# Patient Record
Sex: Female | Born: 1985 | Race: Black or African American | Hispanic: No | Marital: Married | Smoking: Never smoker
Health system: Southern US, Community
[De-identification: ages and names within clinical notes are randomized; demographics above are authoritative.]

## PROBLEM LIST (undated history)

## (undated) DIAGNOSIS — I10 Essential (primary) hypertension: Secondary | ICD-10-CM

## (undated) DIAGNOSIS — Z8759 Personal history of other complications of pregnancy, childbirth and the puerperium: Secondary | ICD-10-CM

---

## 2004-01-19 ENCOUNTER — Encounter: Admission: RE | Admit: 2004-01-19 | Discharge: 2004-01-19 | Payer: Self-pay | Admitting: Family Medicine

## 2004-01-20 ENCOUNTER — Inpatient Hospital Stay (HOSPITAL_COMMUNITY): Admission: AD | Admit: 2004-01-20 | Discharge: 2004-01-27 | Payer: Self-pay | Admitting: Family Medicine

## 2004-01-27 ENCOUNTER — Inpatient Hospital Stay (HOSPITAL_COMMUNITY): Admission: AD | Admit: 2004-01-27 | Discharge: 2004-01-29 | Payer: Self-pay | Admitting: Obstetrics & Gynecology

## 2013-08-14 ENCOUNTER — Encounter (HOSPITAL_BASED_OUTPATIENT_CLINIC_OR_DEPARTMENT_OTHER): Payer: Self-pay | Admitting: Emergency Medicine

## 2013-08-14 ENCOUNTER — Emergency Department (HOSPITAL_BASED_OUTPATIENT_CLINIC_OR_DEPARTMENT_OTHER)
Admission: EM | Admit: 2013-08-14 | Discharge: 2013-08-14 | Disposition: A | Discharge status: Home / Self Care | Payer: Self-pay | Attending: Emergency Medicine | Admitting: Emergency Medicine

## 2013-08-14 DIAGNOSIS — I10 Essential (primary) hypertension: Secondary | ICD-10-CM | POA: Insufficient documentation

## 2013-08-14 DIAGNOSIS — J111 Influenza due to unidentified influenza virus with other respiratory manifestations: Secondary | ICD-10-CM | POA: Insufficient documentation

## 2013-08-14 DIAGNOSIS — R197 Diarrhea, unspecified: Secondary | ICD-10-CM | POA: Insufficient documentation

## 2013-08-14 DIAGNOSIS — R112 Nausea with vomiting, unspecified: Secondary | ICD-10-CM | POA: Insufficient documentation

## 2013-08-14 DIAGNOSIS — Z3202 Encounter for pregnancy test, result negative: Secondary | ICD-10-CM | POA: Insufficient documentation

## 2013-08-14 HISTORY — DX: Essential (primary) hypertension: I10

## 2013-08-14 LAB — PREGNANCY, URINE: Preg Test, Ur: NEGATIVE

## 2013-08-14 MED ORDER — SODIUM CHLORIDE 0.9 % IV BOLUS (SEPSIS)
1000.0000 mL | Freq: Once | INTRAVENOUS | Status: AC
  Administered 2013-08-14: 1000 mL via INTRAVENOUS

## 2013-08-14 MED ORDER — ONDANSETRON HCL 4 MG/2ML IJ SOLN
4.0000 mg | Freq: Once | INTRAMUSCULAR | Status: AC
  Administered 2013-08-14: 4 mg via INTRAVENOUS
  Filled 2013-08-14: qty 2

## 2013-08-14 MED ORDER — OSELTAMIVIR PHOSPHATE 75 MG PO CAPS: 75.0000 mg | ORAL_CAPSULE | Freq: Two times a day (BID) | ORAL | Status: DC

## 2013-08-14 MED ORDER — KETOROLAC TROMETHAMINE 30 MG/ML IJ SOLN
30.0000 mg | Freq: Once | INTRAMUSCULAR | Status: AC
  Administered 2013-08-14: 30 mg via INTRAVENOUS
  Filled 2013-08-14: qty 1

## 2013-08-14 MED ORDER — ONDANSETRON 4 MG PO TBDP: ORAL_TABLET | ORAL | Status: DC

## 2013-08-14 NOTE — ED Notes (Signed)
Fever, chills, cough, emesis x 1, generalized weakness since yesterday.

## 2013-08-14 NOTE — ED Provider Notes (Signed)
CSN: [id number]     Arrival date & time 08/14/13  1417 History   First MD Initiated Contact with Patient 08/14/13 1453     Chief Complaint  Patient presents with  . Fever  . Cough  . flu like illness    (Consider location/radiation/quality/duration/timing/severity/associated sxs/prior Treatment) Patient is a 27 y.o. female presenting with fever and cough. The history is provided by the patient.  Fever Max temp prior to arrival:  Not checked Temp source:  Subjective Severity:  Mild Onset quality:  Sudden Timing:  Constant Progression:  Worsening Chronicity:  New Relieved by:  Nothing Worsened by:  Nothing tried Associated symptoms: chills, congestion, cough (non-productive), diarrhea (multiple loose stools), rhinorrhea and vomiting (once)   Associated symptoms: no chest pain and no rash   Cough Associated symptoms: chills, fever and rhinorrhea   Associated symptoms: no chest pain and no rash     Past Medical History  Diagnosis Date  . Hypertension    No past surgical history on file. No family history on file. History  Substance Use Topics  . Smoking status: Never Smoker   . Smokeless tobacco: Not on file  . Alcohol Use: Not on file   OB History   Grav Para Term Preterm Abortions TAB SAB Ect Mult Living                 Review of Systems  Constitutional: Positive for fever and chills.  HENT: Positive for congestion and rhinorrhea.   Respiratory: Positive for cough (non-productive).   Cardiovascular: Negative for chest pain.  Gastrointestinal: Positive for vomiting (once) and diarrhea (multiple loose stools).  Skin: Negative for rash.  All other systems reviewed and are negative.    Allergies  Review of patient's allergies indicates no known allergies.  Home Medications  No current outpatient prescriptions on file. BP 177/117  Pulse 85  Temp(Src) 99 F (37.2 C)  Resp 16  Ht 5\' 2"  (1.575 m)  Wt 210 lb (95.255 kg)  BMI 38.40 kg/m2  SpO2 99%  LMP  08/09/2013 Physical Exam  Nursing note and vitals reviewed. Constitutional: She is oriented to person, place, and time. She appears well-developed and well-nourished. No distress.  HENT:  Head: Normocephalic and atraumatic.  Eyes: EOM are normal. Pupils are equal, round, and reactive to light.  Neck: Normal range of motion. Neck supple.  Cardiovascular: Normal rate and regular rhythm.  Exam reveals no friction rub.   No murmur heard. Pulmonary/Chest: Effort normal and breath sounds normal. No respiratory distress. She has no wheezes. She has no rales.  Abdominal: Soft. She exhibits no distension. There is no tenderness. There is no rebound.  Musculoskeletal: Normal range of motion. She exhibits no edema.  Neurological: She is alert and oriented to person, place, and time. No cranial nerve deficit. She exhibits normal muscle tone. Coordination normal.  Skin: No rash noted. She is not diaphoretic.    ED Course  Procedures (including critical care time) Labs Review Labs Reviewed - No data to display Imaging Review No results found.  EKG Interpretation   None       MDM   1. Influenza-like illness    40F here with influenza like illness. All symptoms began this yesterday. No meds tried. Afebrile here, vitals stable. Exam benign. No concern with pneumonia with non-productive cough, normal lung exam, normal sats. No need for chest x-ray. Will give fluids, zofran, toradol. Plan on tamiflu prescription as she has young children at home.  Patient feeling much  better after fluids. Given Tamiflu. Stable for discharge  Dagmar Hait, MD 08/14/13 [phone]

## 2014-07-06 ENCOUNTER — Encounter (HOSPITAL_BASED_OUTPATIENT_CLINIC_OR_DEPARTMENT_OTHER): Payer: Self-pay

## 2014-07-06 ENCOUNTER — Emergency Department (HOSPITAL_BASED_OUTPATIENT_CLINIC_OR_DEPARTMENT_OTHER)
Admission: EM | Admit: 2014-07-06 | Discharge: 2014-07-06 | Disposition: A | Discharge status: Home / Self Care | Payer: Self-pay | Attending: Emergency Medicine | Admitting: Emergency Medicine

## 2014-07-06 DIAGNOSIS — I10 Essential (primary) hypertension: Secondary | ICD-10-CM | POA: Insufficient documentation

## 2014-07-06 DIAGNOSIS — R51 Headache: Secondary | ICD-10-CM | POA: Insufficient documentation

## 2014-07-06 DIAGNOSIS — R519 Headache, unspecified: Secondary | ICD-10-CM

## 2014-07-06 MED ORDER — LISINOPRIL 10 MG PO TABS
10.0000 mg | ORAL_TABLET | Freq: Once | ORAL | Status: AC
  Administered 2014-07-06: 10 mg via ORAL
  Filled 2014-07-06: qty 1

## 2014-07-06 MED ORDER — METOCLOPRAMIDE HCL 5 MG/ML IJ SOLN
10.0000 mg | Freq: Once | INTRAMUSCULAR | Status: AC
  Administered 2014-07-06: 10 mg via INTRAVENOUS
  Filled 2014-07-06: qty 2

## 2014-07-06 MED ORDER — DIPHENHYDRAMINE HCL 50 MG/ML IJ SOLN
25.0000 mg | Freq: Once | INTRAMUSCULAR | Status: AC
  Administered 2014-07-06: 25 mg via INTRAVENOUS
  Filled 2014-07-06: qty 1

## 2014-07-06 MED ORDER — SODIUM CHLORIDE 0.9 % IV BOLUS (SEPSIS)
1000.0000 mL | Freq: Once | INTRAVENOUS | Status: AC
Start: 2014-07-06 — End: 2014-07-06
  Administered 2014-07-06: 1000 mL via INTRAVENOUS

## 2014-07-06 MED ORDER — KETOROLAC TROMETHAMINE 30 MG/ML IJ SOLN
30.0000 mg | Freq: Once | INTRAMUSCULAR | Status: AC
  Administered 2014-07-06: 30 mg via INTRAVENOUS
  Filled 2014-07-06: qty 1

## 2014-07-06 MED ORDER — LISINOPRIL 10 MG PO TABS: 10.0000 mg | ORAL_TABLET | Freq: Once | ORAL | Status: DC

## 2014-07-06 NOTE — Discharge Instructions (Signed)
As discussed, it is important that you follow up with your physicians for continued management of your condition. ° °If you develop any new, or concerning changes in your condition, please return to the emergency department immediately. ° ° ° °

## 2014-07-06 NOTE — ED Provider Notes (Signed)
CSN: [id number]     Arrival date & time 07/06/14  1435 History   First MD Initiated Contact with Patient 07/06/14 1516     Chief Complaint  Patient presents with  . Headache      HPI  Patient presents with concerns of headache. This episode began 2 days ago, gradually has become more severe.  The pain is front left sided, sore, nonradiating, severe. No relief with OTC medication. No new visual loss, neck pain, nausea, vomiting, confusion, disorientation. Patient has a history of headaches for years. Patient also has a history of hypertension, for which she stopped taking her medication approximately 8 months ago.   Past Medical History  Diagnosis Date  . Hypertension    History reviewed. No pertinent past surgical history. No family history on file. History  Substance Use Topics  . Smoking status: Never Smoker   . Smokeless tobacco: Not on file  . Alcohol Use: No   OB History    No data available     Review of Systems  Constitutional:       Per HPI, otherwise negative  HENT:       Per HPI, otherwise negative  Respiratory:       Per HPI, otherwise negative  Cardiovascular:       Per HPI, otherwise negative  Gastrointestinal: Negative for vomiting.  Endocrine:       Negative aside from HPI  Genitourinary:       Neg aside from HPI   Musculoskeletal:       Per HPI, otherwise negative  Skin: Negative.   Neurological: Positive for headaches. Negative for dizziness, tremors, seizures, syncope, facial asymmetry, speech difficulty, weakness, light-headedness and numbness.      Allergies  Review of patient's allergies indicates no known allergies.  Home Medications   Prior to Admission medications   Not on File   BP 159/108 mmHg  Pulse 69  Temp(Src) 98.9 F (37.2 C) (Oral)  Resp 18  Ht 5\' 1"  (1.549 m)  Wt 210 lb (95.255 kg)  BMI 39.70 kg/m2  SpO2 100% Physical Exam  Constitutional: She is oriented to person, place, and time. She appears well-developed  and well-nourished. No distress.  HENT:  Head: Normocephalic and atraumatic.  Eyes: Conjunctivae and EOM are normal.  Cardiovascular: Normal rate and regular rhythm.   Pulmonary/Chest: Effort normal and breath sounds normal. No stridor. No respiratory distress.  Abdominal: She exhibits no distension.  Musculoskeletal: She exhibits no edema.  Neurological: She is alert and oriented to person, place, and time. She displays no atrophy and no tremor. No cranial nerve deficit or sensory deficit. She exhibits normal muscle tone. Coordination and gait normal.  Skin: Skin is warm and dry.  Psychiatric: She has a normal mood and affect.  Nursing note and vitals reviewed.   ED Course  Procedures (including critical care time) Patient is hypertensive on arrival.  Patient received her prior home medication in addition to analgesia for headache.  On repeat exam the patient appears better.   MDM  Female presents with concerns of headache, elevated blood pressure. Patient has no neurologic deficits, is awake and alert, otherwise hemodynamically stable. Patient's blood pressure improved here, pain resolved her, and absent alarming findings, she was discharged in stable condition with initiation of blood pressure medication, primary care follow-up.   Gerhard Munchobert Madeline Bebout, MD 07/06/14 [id number]

## 2014-07-06 NOTE — ED Notes (Signed)
C/o HA x 2-3 days-feels like BP is elevated-no BOP meds x 8 monhts

## 2016-10-13 ENCOUNTER — Encounter (HOSPITAL_BASED_OUTPATIENT_CLINIC_OR_DEPARTMENT_OTHER): Payer: Self-pay | Admitting: Emergency Medicine

## 2016-10-13 ENCOUNTER — Emergency Department (HOSPITAL_BASED_OUTPATIENT_CLINIC_OR_DEPARTMENT_OTHER)
Admission: EM | Admit: 2016-10-13 | Discharge: 2016-10-13 | Disposition: A | Discharge status: Home / Self Care | Payer: No Typology Code available for payment source | Attending: Emergency Medicine | Admitting: Emergency Medicine

## 2016-10-13 ENCOUNTER — Emergency Department (HOSPITAL_BASED_OUTPATIENT_CLINIC_OR_DEPARTMENT_OTHER): Payer: No Typology Code available for payment source

## 2016-10-13 DIAGNOSIS — T148XXA Other injury of unspecified body region, initial encounter: Secondary | ICD-10-CM

## 2016-10-13 DIAGNOSIS — I10 Essential (primary) hypertension: Secondary | ICD-10-CM | POA: Insufficient documentation

## 2016-10-13 DIAGNOSIS — S161XXA Strain of muscle, fascia and tendon at neck level, initial encounter: Secondary | ICD-10-CM | POA: Insufficient documentation

## 2016-10-13 DIAGNOSIS — Y999 Unspecified external cause status: Secondary | ICD-10-CM | POA: Diagnosis not present

## 2016-10-13 DIAGNOSIS — Y92410 Unspecified street and highway as the place of occurrence of the external cause: Secondary | ICD-10-CM | POA: Insufficient documentation

## 2016-10-13 DIAGNOSIS — R030 Elevated blood-pressure reading, without diagnosis of hypertension: Secondary | ICD-10-CM

## 2016-10-13 DIAGNOSIS — Y9389 Activity, other specified: Secondary | ICD-10-CM | POA: Diagnosis not present

## 2016-10-13 DIAGNOSIS — S199XXA Unspecified injury of neck, initial encounter: Secondary | ICD-10-CM | POA: Diagnosis present

## 2016-10-13 MED ORDER — ACETAMINOPHEN 325 MG PO TABS
650.0000 mg | ORAL_TABLET | Freq: Once | ORAL | Status: AC
  Administered 2016-10-13: 650 mg via ORAL
  Filled 2016-10-13: qty 2

## 2016-10-13 MED ORDER — LISINOPRIL 10 MG PO TABS
10.0000 mg | ORAL_TABLET | Freq: Once | ORAL | Status: AC
  Administered 2016-10-13: 10 mg via ORAL
  Filled 2016-10-13: qty 1

## 2016-10-13 MED ORDER — METHOCARBAMOL 500 MG PO TABS: 500.0000 mg | ORAL_TABLET | Freq: Two times a day (BID) | ORAL | 0 refills | Status: DC | PRN

## 2016-10-13 MED ORDER — LISINOPRIL 10 MG PO TABS: 10.0000 mg | ORAL_TABLET | Freq: Every day | ORAL | 0 refills | Status: DC

## 2016-10-13 NOTE — Discharge Instructions (Signed)
Tylenol as needed for pain. Robaxin is your muscle relaxer to take as needed - This can make you very drowsy - please do not drink alcohol, operate heavy machinery or drive on this medication. This is best taken about an hour before bedtime. Ice or heat to affected area for additional pain relief.   Please keep your scheduled appointment on Tuesday for discussion of your blood pressure.   Return to the ER for new or worsening symptoms, any additional concerns.   COLD THERAPY DIRECTIONS:  Ice or gel packs can be used to reduce both pain and swelling. Ice is the most helpful within the first 24 to 48 hours after an injury or flareup from overusing a muscle or joint.  Ice is effective, has very few side effects, and is safe for most people to use.   If you expose your skin to cold temperatures for too long or without the proper protection, you can damage your skin or nerves. Watch for signs of skin damage due to cold.   HOME CARE INSTRUCTIONS  Follow these tips to use ice and cold packs safely.  Place a dry or damp towel between the ice and skin. A damp towel will cool the skin more quickly, so you may need to shorten the time that the ice is used.  For a more rapid response, add gentle compression to the ice.  Ice for no more than 10 to 20 minutes at a time. The bonier the area you are icing, the less time it will take to get the benefits of ice.  Check your skin after 5 minutes to make sure there are no signs of a poor response to cold or skin damage.  Rest 20 minutes or more in between uses.  Once your skin is numb, you can end your treatment. You can test numbness by very lightly touching your skin. The touch should be so light that you do not see the skin dimple from the pressure of your fingertip. When using ice, most people will feel these normal sensations in this order: cold, burning, aching, and numbness.
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
## 2017-06-05 ENCOUNTER — Emergency Department (HOSPITAL_BASED_OUTPATIENT_CLINIC_OR_DEPARTMENT_OTHER)
Admission: EM | Admit: 2017-06-05 | Discharge: 2017-06-05 | Disposition: A | Discharge status: Other Acute Inpatient Hospital | Payer: Medicaid Other | Attending: Emergency Medicine | Admitting: Emergency Medicine

## 2017-06-05 ENCOUNTER — Emergency Department (HOSPITAL_BASED_OUTPATIENT_CLINIC_OR_DEPARTMENT_OTHER): Payer: Medicaid Other

## 2017-06-05 ENCOUNTER — Encounter (HOSPITAL_BASED_OUTPATIENT_CLINIC_OR_DEPARTMENT_OTHER): Payer: Self-pay

## 2017-06-05 DIAGNOSIS — R102 Pelvic and perineal pain: Secondary | ICD-10-CM | POA: Diagnosis not present

## 2017-06-05 DIAGNOSIS — R509 Fever, unspecified: Secondary | ICD-10-CM | POA: Diagnosis present

## 2017-06-05 DIAGNOSIS — I10 Essential (primary) hypertension: Secondary | ICD-10-CM | POA: Diagnosis not present

## 2017-06-05 DIAGNOSIS — A419 Sepsis, unspecified organism: Secondary | ICD-10-CM

## 2017-06-05 DIAGNOSIS — Z79899 Other long term (current) drug therapy: Secondary | ICD-10-CM | POA: Insufficient documentation

## 2017-06-05 HISTORY — DX: Personal history of other complications of pregnancy, childbirth and the puerperium: Z87.59

## 2017-06-05 LAB — URINALYSIS, MICROSCOPIC (REFLEX)

## 2017-06-05 LAB — WET PREP, GENITAL
Clue Cells Wet Prep HPF POC: NONE SEEN
Sperm: NONE SEEN
Trich, Wet Prep: NONE SEEN
YEAST WET PREP: NONE SEEN

## 2017-06-05 LAB — COMPREHENSIVE METABOLIC PANEL
ALBUMIN: 4 g/dL (ref 3.5–5.0)
ALK PHOS: 46 U/L (ref 38–126)
ALT: 10 U/L — ABNORMAL LOW (ref 14–54)
ANION GAP: 10 (ref 5–15)
AST: 17 U/L (ref 15–41)
BILIRUBIN TOTAL: 0.8 mg/dL (ref 0.3–1.2)
BUN: 9 mg/dL (ref 6–20)
CALCIUM: 9.1 mg/dL (ref 8.9–10.3)
CO2: 22 mmol/L (ref 22–32)
Chloride: 102 mmol/L (ref 101–111)
Creatinine, Ser: 0.5 mg/dL (ref 0.44–1.00)
GFR calc Af Amer: >60 mL/min (ref >60)
GFR calc non Af Amer: >60 mL/min (ref >60)
GLUCOSE: 114 mg/dL — AB (ref 65–99)
Potassium: 2.8 mmol/L — ABNORMAL LOW (ref 3.5–5.1)
Sodium: 134 mmol/L — ABNORMAL LOW (ref 135–145)
TOTAL PROTEIN: 7.4 g/dL (ref 6.5–8.1)

## 2017-06-05 LAB — URINALYSIS, ROUTINE W REFLEX MICROSCOPIC
Bilirubin Urine: NEGATIVE
Glucose, UA: NEGATIVE mg/dL
KETONES UR: NEGATIVE mg/dL
NITRITE: NEGATIVE
PROTEIN: NEGATIVE mg/dL
Specific Gravity, Urine: 1.015 (ref 1.005–1.030)
pH: 6 (ref 5.0–8.0)

## 2017-06-05 LAB — CBC WITH DIFFERENTIAL/PLATELET
BASOS PCT: 0 %
Basophils Absolute: 0 10*3/uL (ref 0.0–0.1)
EOS ABS: 0 10*3/uL (ref 0.0–0.7)
EOS PCT: 0 %
HCT: 38.2 % (ref 36.0–46.0)
Hemoglobin: 13.4 g/dL (ref 12.0–15.0)
Lymphocytes Relative: 4 %
Lymphs Abs: 0.5 10*3/uL — ABNORMAL LOW (ref 0.7–4.0)
MCH: 34.1 pg — ABNORMAL HIGH (ref 26.0–34.0)
MCHC: 35.1 g/dL (ref 30.0–36.0)
MCV: 97.2 fL (ref 78.0–100.0)
MONO ABS: 0.3 10*3/uL (ref 0.1–1.0)
Monocytes Relative: 2 %
NEUTROS ABS: 11.2 10*3/uL — AB (ref 1.7–7.7)
Neutrophils Relative %: 94 %
PLATELETS: 293 10*3/uL (ref 150–400)
RBC: 3.93 MIL/uL (ref 3.87–5.11)
RDW: 11.9 % (ref 11.5–15.5)
WBC: 12 10*3/uL — ABNORMAL HIGH (ref 4.0–10.5)

## 2017-06-05 LAB — I-STAT CG4 LACTIC ACID, ED: Lactic Acid, Venous: 1.81 mmol/L (ref 0.5–1.9)

## 2017-06-05 LAB — HCG, QUANTITATIVE, PREGNANCY: HCG, BETA CHAIN, QUANT, S: 2863 m[IU]/mL — AB (ref <5)

## 2017-06-05 LAB — LIPASE, BLOOD: Lipase: 22 U/L (ref 11–51)

## 2017-06-05 LAB — PREGNANCY, URINE: PREG TEST UR: POSITIVE — AB

## 2017-06-05 MED ORDER — ACETAMINOPHEN 500 MG PO TABS
1000.0000 mg | ORAL_TABLET | Freq: Once | ORAL | Status: AC
  Administered 2017-06-05: 1000 mg via ORAL
  Filled 2017-06-05: qty 2

## 2017-06-05 MED ORDER — CLINDAMYCIN PHOSPHATE 600 MG/50ML IV SOLN
600.0000 mg | Freq: Once | INTRAVENOUS | Status: AC
  Administered 2017-06-05: 600 mg via INTRAVENOUS
  Filled 2017-06-05: qty 50

## 2017-06-05 MED ORDER — GENTAMICIN IN SALINE 1.6-0.9 MG/ML-% IV SOLN
INTRAVENOUS | Status: AC
  Filled 2017-06-05: qty 50

## 2017-06-05 MED ORDER — SODIUM CHLORIDE 0.9 % IV BOLUS (SEPSIS)
1000.0000 mL | Freq: Once | INTRAVENOUS | Status: AC
  Administered 2017-06-05: 1000 mL via INTRAVENOUS

## 2017-06-05 MED ORDER — POTASSIUM CHLORIDE 10 MEQ/100ML IV SOLN
10.0000 meq | Freq: Once | INTRAVENOUS | Status: AC
  Administered 2017-06-05: 10 meq via INTRAVENOUS
  Filled 2017-06-05: qty 100

## 2017-06-05 MED ORDER — AMPICILLIN SODIUM 2 G IJ SOLR
INTRAMUSCULAR | Status: AC
  Filled 2017-06-05: qty 2000

## 2017-06-05 MED ORDER — SODIUM CHLORIDE 0.9 % IV SOLN
2.0000 g | Freq: Once | INTRAVENOUS | Status: AC
  Administered 2017-06-05: 2 g via INTRAVENOUS
  Filled 2017-06-05: qty 2000

## 2017-06-05 MED ORDER — GENTAMICIN SULFATE 40 MG/ML IJ SOLN
1.5000 mg/kg | Freq: Once | INTRAMUSCULAR | Status: DC
  Filled 2017-06-05: qty 3.75

## 2017-06-05 MED ORDER — GENTAMICIN SULFATE 40 MG/ML IJ SOLN
160.0000 mg | Freq: Once | INTRAVENOUS | Status: AC
  Administered 2017-06-05: 160 mg via INTRAVENOUS
  Filled 2017-06-05: qty 4

## 2017-06-05 NOTE — ED Notes (Signed)
ED Provider at bedside. 

## 2017-06-05 NOTE — ED Notes (Signed)
[facility] here for transport to [facility] ED

## 2017-06-05 NOTE — ED Triage Notes (Addendum)
C/o"shaking" while at work-c/o n/v today-pelvic pain since miscarriage approx 17 days ago and c/o lower back pain-NAD-steady gait-pt took motrin  PTA

## 2017-06-05 NOTE — ED Provider Notes (Signed)
[facility] Provider Note   CSN: [id number] Arrival date & time: 06/05/17  1150     History   Chief Complaint Chief Complaint  Patient presents with  . Shaking    HPI Sophia Jones is a 31 y.o. female.  The history is provided by the patient.  Fever   This is a new problem. The current episode started 6 to 12 hours ago. The problem occurs constantly. Progression since onset: fluctuating. Her temperature was unmeasured prior to arrival. Pertinent negatives include no chest pain, no fussiness, no diarrhea, no vomiting, no congestion, no headaches, no sore throat, no muscle aches and no cough. She has tried ibuprofen for the symptoms. The treatment provided moderate relief.   No known sick contacts.  Of note the patient's had recent miscarriage approximately 17 days ago; given Cytotec. Bleeding had resolved until 2 days ago when she had a Mirena IUD placed. Since, she has been having suprapubic pain and small vaginal clots. No discharge or prior STIs. No dysuria, but is having frequency.   Past Medical History:  Diagnosis Date  . History of stillbirth    Preterm stillbirth via c-section 2008  . Hypertension     There are no active problems to display for this patient.   Past Surgical History:  Procedure Laterality Date  . CESAREAN SECTION  2012  . CESAREAN SECTION  2008   Baby was stillborn    OB History    Gravida Para Term Preterm AB Living   3 2 0 SAB TAB Ectopic Multiple Live Births   1 0 0 0 1       Home Medications    Prior to Admission medications   Medication Sig Start Date End Date Taking? Authorizing Provider  amLODipine (NORVASC) 5 MG tablet Take 5 mg by mouth daily.   Yes [provider]  hydrochlorothiazide (MICROZIDE) 12.5 MG capsule Take 12.5 mg by mouth daily.   Yes [provider]    Family History No family history on file.  Social History Social History  Substance Use Topics  . Smoking  status: Never Smoker  . Smokeless tobacco: Never Used  . Alcohol use No     Allergies   Patient has no known allergies.   Review of Systems Review of Systems  Constitutional: Positive for fever.  HENT: Negative for congestion and sore throat.   Respiratory: Negative for cough.   Cardiovascular: Negative for chest pain.  Gastrointestinal: Negative for diarrhea and vomiting.  Neurological: Negative for headaches.   All other systems are reviewed and are negative for acute change except as noted in the HPI   Physical Exam Updated Vital Signs BP (!) 143/91 (BP Location: Right Arm)   Pulse (!) 129   Temp (!) 101.2 F (38.4 C)   Resp 20   Ht  (1.626 m)   Wt 99.8 kg (220 lb)   LMP  (LMP Unknown)   SpO2 99%   BMI 37.76 kg/m   Physical Exam  Constitutional: She is oriented to person, place, and time. She appears well-developed and well-nourished. No distress.  HENT:  Head: Normocephalic and atraumatic.  Nose: Nose normal.  Eyes: Pupils are equal, round, and reactive to light. Conjunctivae and EOM are normal. Right eye exhibits no discharge. Left eye exhibits no discharge. No scleral icterus.  Neck: Normal range of motion. Neck supple.  Cardiovascular: Normal rate and regular rhythm.  Exam reveals no gallop and no friction rub.  No murmur heard. Pulmonary/Chest: Effort normal and breath sounds normal. No stridor. No respiratory distress. She has no rales.  Abdominal: Soft. She exhibits no distension. There is tenderness in the suprapubic area. There is no rigidity, no rebound, no guarding, no CVA tenderness, no tenderness at McBurney's point and negative Murphy's sign. No hernia.  Genitourinary: Pelvic exam was performed with patient supine. Uterus is tender. Cervix exhibits no motion tenderness, no discharge and no friability. Right adnexum displays no mass and no tenderness. Left adnexum displays no mass and no tenderness. There is bleeding in the vagina.  Genitourinary  Comments: Chaperone present during pelvic exam. IUD string visualized.  Musculoskeletal: She exhibits no edema or tenderness.  Neurological: She is alert and oriented to person, place, and time.  Skin: Skin is warm and dry. No rash noted. She is not diaphoretic. No erythema.  Psychiatric: She has a normal mood and affect.  Vitals reviewed.    ED Treatments / Results  Labs (all labs ordered are listed, but only abnormal results are displayed) Labs Reviewed  WET PREP, GENITAL - Abnormal; Notable for the following:       Result Value   WBC, Wet Prep HPF POC MANY (*)    All other components within normal limits  COMPREHENSIVE METABOLIC PANEL - Abnormal; Notable for the following:    Sodium 134 (*)    Potassium 2.8 (*)    Glucose, Bld 114 (*)    ALT 10 (*)    All other components within normal limits  CBC WITH DIFFERENTIAL/PLATELET - Abnormal; Notable for the following:    WBC 12.0 (*)    MCH 34.1 (*)    Neutro Abs 11.2 (*)    Lymphs Abs 0.5 (*)    All other components within normal limits  URINALYSIS, ROUTINE W REFLEX MICROSCOPIC - Abnormal; Notable for the following:    APPearance HAZY (*)    Hgb urine dipstick LARGE (*)    Leukocytes, UA SMALL (*)    All other components within normal limits  PREGNANCY, URINE - Abnormal; Notable for the following:    Preg Test, Ur POSITIVE (*)    All other components within normal limits  URINALYSIS, MICROSCOPIC (REFLEX) - Abnormal; Notable for the following:    Bacteria, UA FEW (*)    Squamous Epithelial / LPF 0-5 (*)    All other components within normal limits  HCG, QUANTITATIVE, PREGNANCY - Abnormal; Notable for the following:    hCG, Beta Chain, Quant, S 2,863 (*)    All other components within normal limits  CULTURE, BLOOD (ROUTINE X 2)  CULTURE, BLOOD (ROUTINE X 2)  URINE CULTURE  LIPASE, BLOOD  I-STAT CG4 LACTIC ACID, ED  I-STAT CG4 LACTIC ACID, ED  GC/CHLAMYDIA PROBE AMP ([facility]) NOT AT [facility]    EKG  EKG  Interpretation  Date/Time:  Thursday June 05 2017 12:41:31 EDT Ventricular Rate:  117 PR Interval:    QRS Duration: 90 QT Interval:  316 QTC Calculation: 441 R Axis:   3 Text Interpretation:  Sinus tachycardia Low voltage, precordial leads Baseline wander in lead(s) II III aVF Confirmed by Drema Pry ([id number]) on 06/05/2017 12:59:16 PM       Radiology No results found.  Procedures Procedures (including critical care time)  Medications Ordered in ED Medications  ampicillin (OMNIPEN) 2 g in sodium chloride 0.9 % 50 mL IVPB (2 g Intravenous New Bag/Given 06/05/17 1557)  clindamycin (CLEOCIN) IVPB 600 mg (600 mg Intravenous New Bag/Given 06/05/17 1552)  gentamicin (GARAMYCIN) 1.6-0.9 MG/ML-% IVPB (not administered)  gentamicin (GARAMYCIN) 160 mg in dextrose 5 % 50 mL IVPB (not administered)  acetaminophen (TYLENOL) tablet 1,000 mg (1,000 mg Oral Given 06/05/17 1242)  sodium chloride 0.9 % bolus 1,000 mL (1,000 mLs Intravenous New Bag/Given 06/05/17 1434)  potassium chloride 10 mEq in 100 mL IVPB (0 mEq Intravenous Stopped 06/05/17 1554)  ampicillin (OMNIPEN) 2 g injection (  Return to [facility] 06/05/17 1554)     Initial Impression / Assessment and Plan / ED Course  I have reviewed the triage vital signs and the nursing notes.  Pertinent labs & imaging results that were available during my care of the patient were reviewed by me and considered in my medical decision making (see chart for details).     No other infectious symptoms. UA without evidence of infection.  Presentation is most concerning for endometritis vs septic abortion. Started on IVF, and empiric ABx (Gent, Amp, and Clinda)  Will obtain pelvic US to assess for retained products of conception.  Consulting Dr. Shawnie Pons (pt's Ob/Gyn) to discuss admission. He requested we transfer the patient to [facility] emergency department where he will evaluate the patient there.  Discussed the case with Dr. Neoma Laming from the emergency department at [facility] regional who accepted the patient in transfer.  Final Clinical Impressions(s) / ED Diagnoses   Final diagnoses:  Sepsis, due to unspecified organism [facility])      Cardama, Amadeo Garnet, MD 06/05/17 1606

## 2017-06-05 NOTE — ED Notes (Signed)
Patient transported to Ultrasound 

## 2017-06-05 NOTE — ED Notes (Signed)
amb to BR w/o difficulty 

## 2017-06-06 LAB — URINE CULTURE: CULTURE: NO GROWTH

## 2017-06-06 LAB — GC/CHLAMYDIA PROBE AMP (~~LOC~~) NOT AT ARMC
Chlamydia: NEGATIVE
NEISSERIA GONORRHEA: NEGATIVE

## 2017-06-07 LAB — BLOOD CULTURE ID PANEL (REFLEXED)
Acinetobacter baumannii: NOT DETECTED
CANDIDA TROPICALIS: NOT DETECTED
Candida albicans: NOT DETECTED
Candida glabrata: NOT DETECTED
Candida krusei: NOT DETECTED
Candida parapsilosis: NOT DETECTED
Enterobacter cloacae complex: NOT DETECTED
Enterobacteriaceae species: NOT DETECTED
Enterococcus species: NOT DETECTED
Escherichia coli: NOT DETECTED
HAEMOPHILUS INFLUENZAE: NOT DETECTED
Klebsiella oxytoca: NOT DETECTED
Klebsiella pneumoniae: NOT DETECTED
Listeria monocytogenes: NOT DETECTED
NEISSERIA MENINGITIDIS: NOT DETECTED
PROTEUS SPECIES: NOT DETECTED
Pseudomonas aeruginosa: NOT DETECTED
STAPHYLOCOCCUS SPECIES: NOT DETECTED
STREPTOCOCCUS AGALACTIAE: NOT DETECTED
STREPTOCOCCUS SPECIES: NOT DETECTED
Serratia marcescens: NOT DETECTED
Staphylococcus aureus (BCID): NOT DETECTED
Streptococcus pneumoniae: NOT DETECTED
Streptococcus pyogenes: NOT DETECTED

## 2017-06-08 ENCOUNTER — Telehealth (HOSPITAL_BASED_OUTPATIENT_CLINIC_OR_DEPARTMENT_OTHER): Payer: Self-pay | Admitting: Emergency Medicine

## 2017-06-10 LAB — CULTURE, BLOOD (ROUTINE X 2)
CULTURE: NO GROWTH
Special Requests: ADEQUATE
Special Requests: ADEQUATE

## 2019-06-17 IMAGING — US US OB COMP LESS 14 WK
1 series · 13 of 28 positions shown · non-contrast
Comparison: None.

CLINICAL DATA: Pelvic pain, nausea and vomiting, recent
miscarriage? Positive pregnancy test today. Per sonographer, IUD
placed on [REDACTED], 06/03/2017.

EXAM:
OBSTETRIC <14 WK US AND TRANSVAGINAL OB US
TECHNIQUE: Both transabdominal and transvaginal ultrasound examinations were
performed for complete evaluation of the gestation as well as the
maternal uterus, adnexal regions, and pelvic cul-de-sac.
Transvaginal technique was performed to assess early pregnancy.

[Series 1: us ob comp less 14 wk · 0.22mm/px · 13 of 57 slices shown]
[im 3/57]
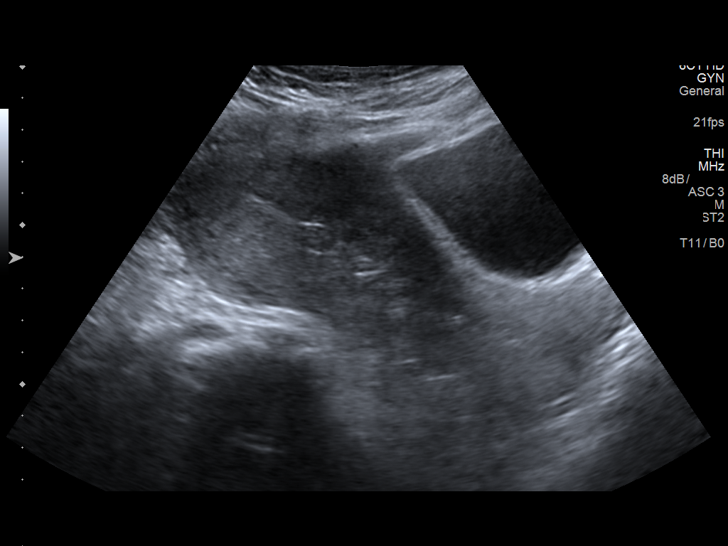
[im 7/57]
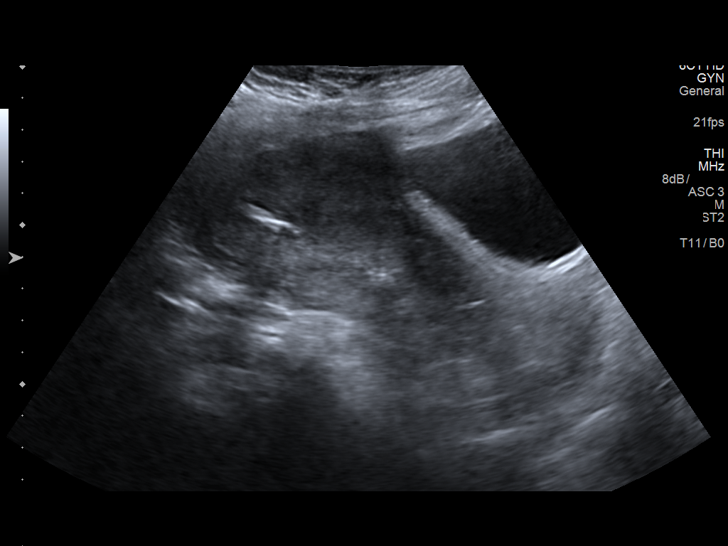
[im 11/57]
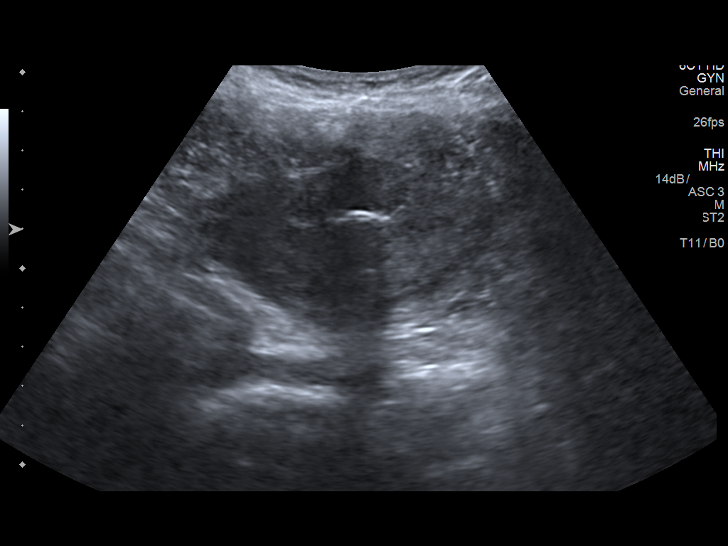
[im 15/57]
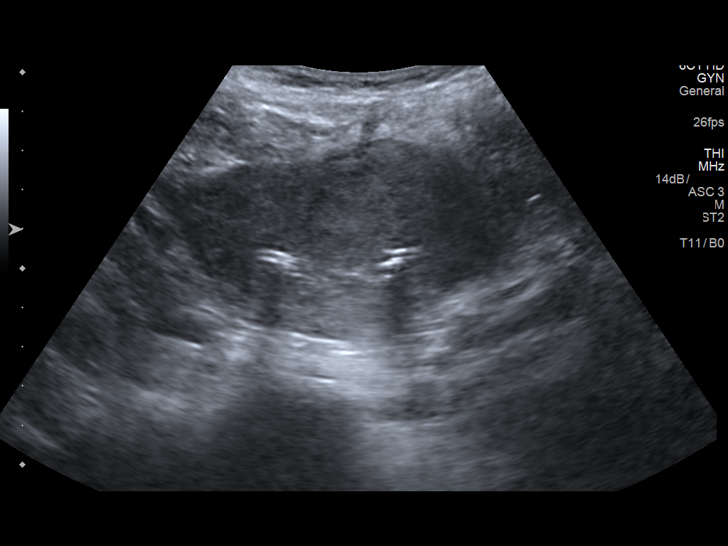
[im 19/57]
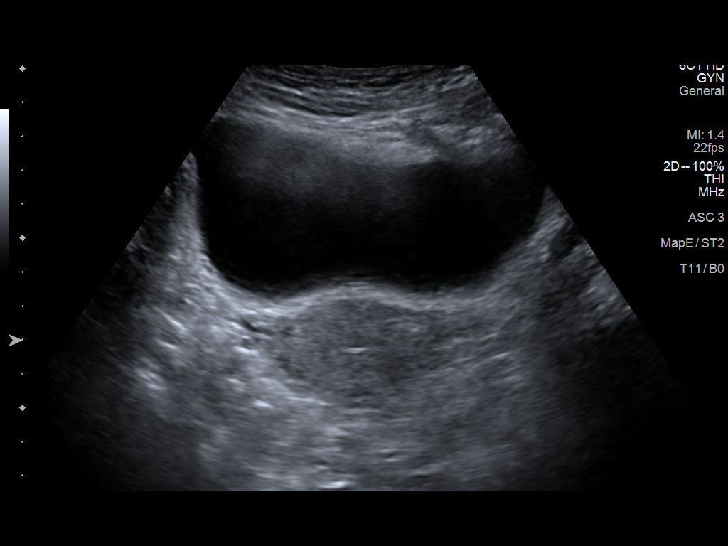
[im 23/57]
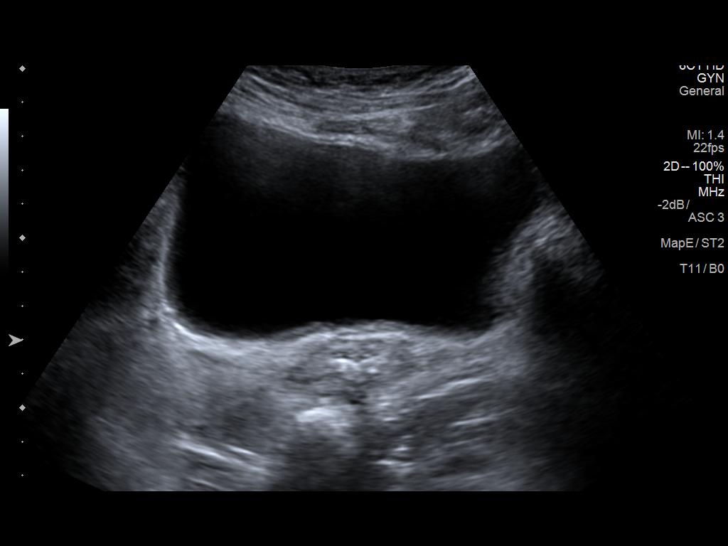
[im 30/57]
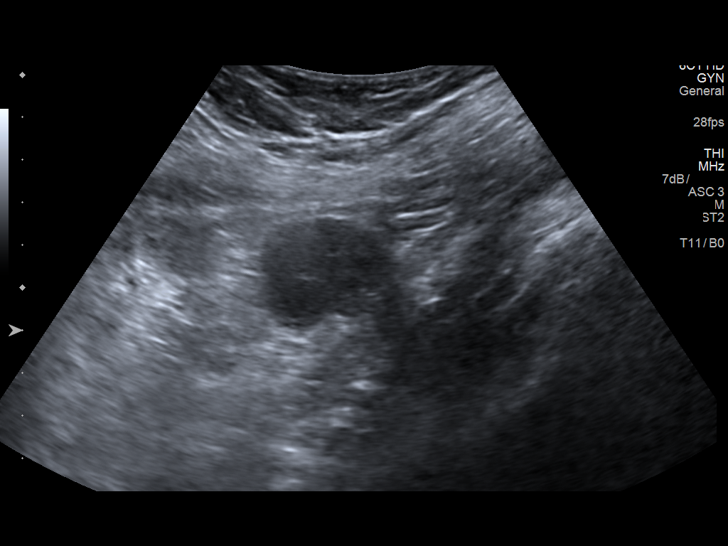
[im 34/57]
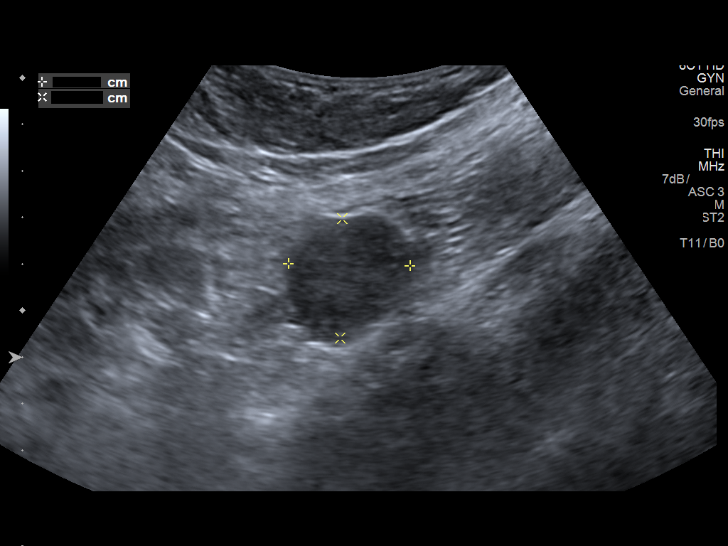
[im 38/57]
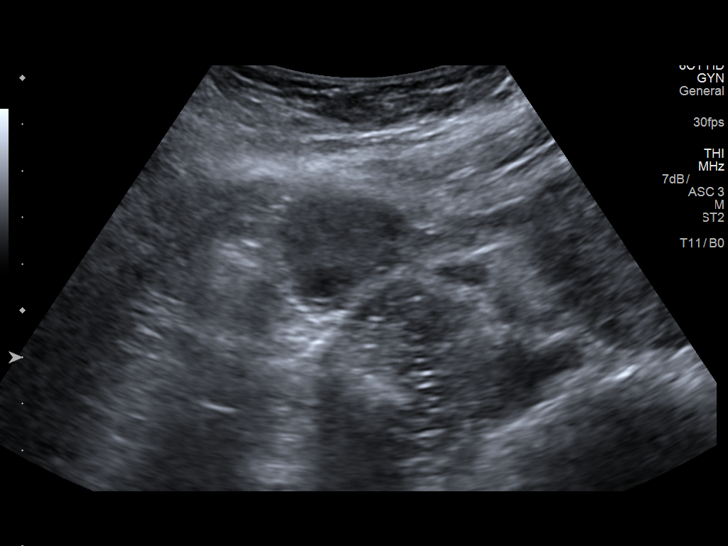
[im 42/57]
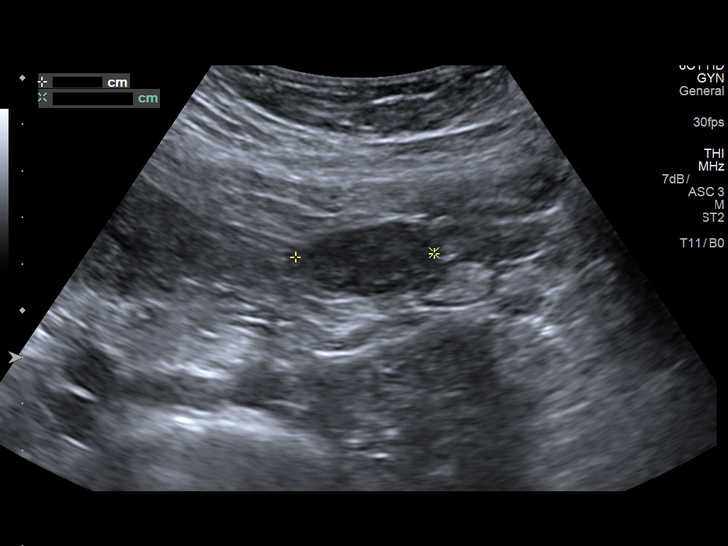
[im 46/57]
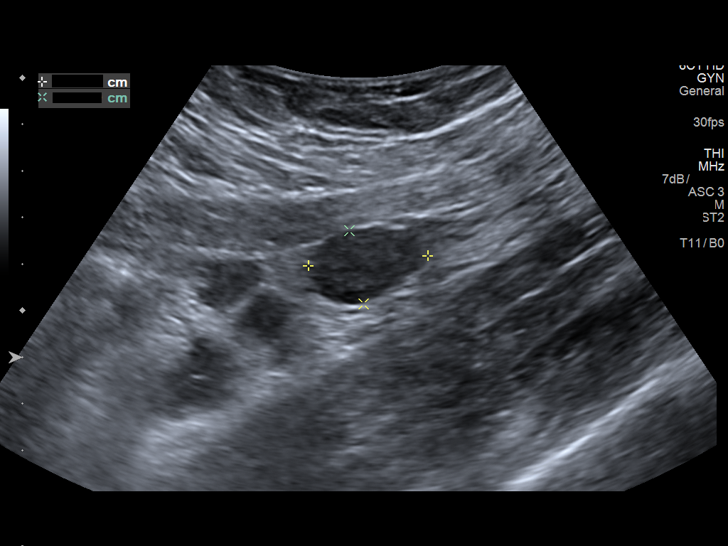
[im 50/57]
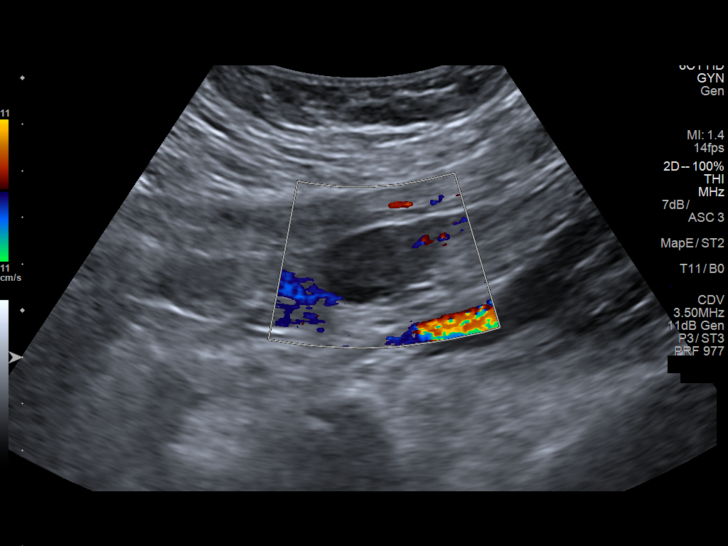
[im 54/57]
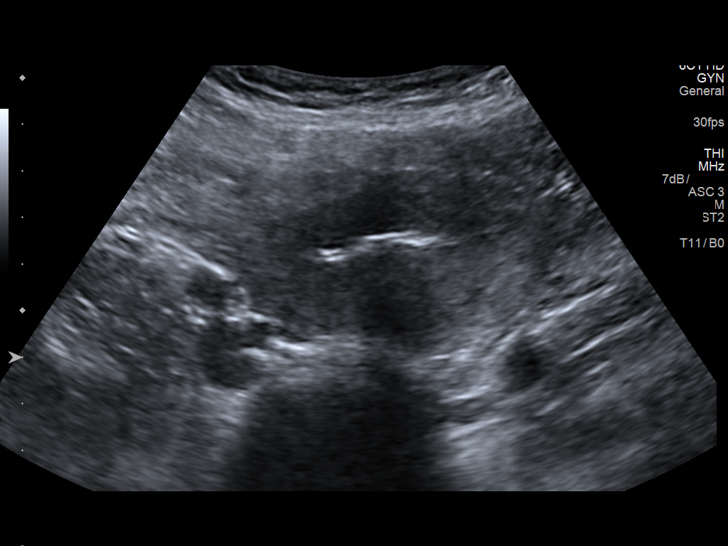

[13 of 28 positions shown; findings below may reference images not displayed]

FINDINGS: Intrauterine gestational sac: Not seen.

Yolk sac:  Not seen

Embryo:  Not seen

Subchorionic hemorrhage:  None visualized.

Maternal uterus/adnexae: Hypoechoic masslike focus within the
anterior uterine body, measuring 3.5 x 3 cm, most likely a fibroid.

Intrauterine device is seen within the lower uterine segment. The
intrauterine device does not appear to extend into the more distal
portion of the endometrial canal.

Complex mixed echogenicity material is seen within the endometrial
canal, presumably blood products.

Right ovary appears normal and there is no mass or free fluid within
the adjacent right adnexal region. Left ovary appears normal and
there is no mass or free fluid seen within the left adnexal region.
IMPRESSION: 1. Intrauterine device is visible within the lower uterine segment.
The IUD does not appear to extend into the more distal portion of
the endometrial canal. Consider GYN consultation for possible
repositioning.
2. No intrauterine pregnancy identified.
3. Complex mixed echogenicity material within the endometrial canal,
presumably blood products. Retained products of conception cannot be
excluded given the possibility of recent miscarriage.
4. No evidence of ectopic pregnancy seen. No mass or free fluid
identified within either adnexal region. Recommend follow-up with
serial beta HCG levels and follow-up pelvic ultrasound as needed.
5. Probable uterine fibroid, within the anterior uterine myometrium,
measuring 3.5 cm. Alternatively, uterine scarring related to earlier
procedure.
